# Patient Record
Sex: Male | Born: 1990 | Race: White | Hispanic: Yes | Marital: Single | Smoking: Never smoker
Health system: Southern US, Community
[De-identification: ages and names within clinical notes are randomized; demographics above are authoritative.]

---

## 2015-01-18 ENCOUNTER — Ambulatory Visit
Admission: RE | Admit: 2015-01-18 | Discharge: 2015-01-18 | Disposition: A | Discharge status: Home / Self Care | Payer: Self-pay | Source: Ambulatory Visit | Attending: *Deleted | Admitting: *Deleted

## 2015-01-18 ENCOUNTER — Other Ambulatory Visit: Payer: Self-pay | Admitting: *Deleted

## 2015-01-18 DIAGNOSIS — R52 Pain, unspecified: Secondary | ICD-10-CM

## 2015-01-19 ENCOUNTER — Encounter (HOSPITAL_COMMUNITY): Payer: Self-pay | Admitting: Emergency Medicine

## 2015-01-19 ENCOUNTER — Emergency Department (HOSPITAL_COMMUNITY)
Admission: EM | Admit: 2015-01-19 | Discharge: 2015-01-19 | Disposition: A | Discharge status: Home / Self Care | Payer: 59 | Source: Home / Self Care | Attending: Emergency Medicine | Admitting: Emergency Medicine

## 2015-01-19 DIAGNOSIS — S62001A Unspecified fracture of navicular [scaphoid] bone of right wrist, initial encounter for closed fracture: Secondary | ICD-10-CM

## 2015-01-19 NOTE — ED Notes (Signed)
Pt was sent by [facility] doctor here for right scaphoid fx onset 3 days Reports he was punching a punching bag and inj his right wrist [facility]  Alert, no signs of acute distress

## 2015-01-19 NOTE — ED Provider Notes (Signed)
CSN: [id number]     Arrival date & time 01/19/15  1846 History   First MD Initiated Contact with Patient 01/19/15 1932     Chief Complaint  Patient presents with  . Hand Injury   (Consider location/radiation/quality/duration/timing/severity/associated sxs/prior Treatment) HPI      24 year old male presents on referral from his primary care doctor. He was punching a punching bag 3 days ago when he injured his wrist. He had an x-ray and was found to have a nondisplaced scaphoid fracture. His doctor told him to hold his hand like he is holding a cup and to not move it until he gets a splint or a cast.  He has pain in his wrist around the base of his thumb as well as some swelling. No numbness, no other injury  History reviewed. No pertinent past medical history. History reviewed. No pertinent past surgical history. No family history on file. History  Substance Use Topics  . Smoking status: Never Smoker   . Smokeless tobacco: Not on file  . Alcohol Use: No    Review of Systems  Musculoskeletal: Positive for arthralgias.  All other systems reviewed and are negative.   Allergies  Review of patient's allergies indicates no known allergies.  Home Medications   Prior to Admission medications   Not on File   BP 117/73 mmHg  Pulse 72  Temp(Src) 98.7 F (37.1 C) (Oral)  Resp 16  SpO2 100% Physical Exam  Constitutional: He is oriented to person, place, and time. He appears well-developed and well-nourished. No distress.  HENT:  Head: Normocephalic.  Pulmonary/Chest: Effort normal. No respiratory distress.  Musculoskeletal:       Right hand: He exhibits tenderness (anatomic snuffbox tenderness) and swelling.  Neurological: He is alert and oriented to person, place, and time. Coordination normal.  Skin: Skin is warm and dry. No rash noted. He is not diaphoretic.  Psychiatric: He has a normal mood and affect. Judgment normal.  Nursing note and vitals reviewed.   ED Course   Procedures (including critical care time) Labs Review Labs Reviewed - No data to display  Imaging Review Dg Shoulder Right  01/18/2015   CLINICAL DATA:  Right shoulder injury 01/16/2015 when he hitting a punching bag. Initial encounter.  EXAM: RIGHT SHOULDER - 2+ VIEW  COMPARISON:  None.  FINDINGS: Imaged bones, joints and soft tissues appear normal.  IMPRESSION: Normal exam.   Electronically Signed   By: Drusilla Kannerhomas  Dalessio M.D.   On: 01/18/2015 21:18   Dg Wrist Complete Right  01/18/2015   CLINICAL DATA:  Right wrist injury 01/16/2015 when hitting a punching bag. Initial encounter.  EXAM: RIGHT WRIST - COMPLETE 3+ VIEW  COMPARISON:  None.  FINDINGS: The patient has a nondisplaced fracture through the waist of the scaphoid. No other acute bony or joint abnormality is identified. Soft tissue swelling is present about the wrist.  IMPRESSION: Nondisplaced fracture waist of the scaphoid.   Electronically Signed   By: Drusilla Kannerhomas  Dalessio M.D.   On: 01/18/2015 21:17     MDM   1. Scaphoid fracture, wrist, closed, right, initial encounter    He was put in a thumb spica splint and referred to hand surgery for follow-up.    Graylon GoodZachary H Dominic Mahaney, PA-C 01/19/15 [phone]

## 2015-01-19 NOTE — Progress Notes (Signed)
Orthopedic Tech Progress Note Patient Details:  Beatris SiRobert Garris 1991-09-11 [id number] Applied fiberglass thumb spica splint to RUE.  Pulses, motion, sensation intact before and after splinting.  Capillary refill less than 2 seconds before and after splinting. Ortho Devices Type of Ortho Device: Thumb spica splint Splint Material: Fiberglass Ortho Device/Splint Location: RUE Ortho Device/Splint Interventions: Application   Lesle ChrisGilliland, Vaniyah Lansky L 01/19/2015, 8:20 PM

## 2015-01-19 NOTE — ED Notes (Signed)
ortho tech paged. 

## 2015-01-19 NOTE — Discharge Instructions (Signed)
Scaphoid Fracture, Wrist A fracture is a break in the bone. The bone you have broken often does not show up as a fracture on x-ray until later on in the healing phase. This bone is called the scaphoid bone. With this bone, your caregiver will often cast or splint your wrist as though it is fractured, even if a fracture is not seen on the x-ray. This is often done with wrist injuries in which there is tenderness at the base of the thumb. An x-ray at 1-3 weeks after your injury may confirm this fracture. A cast or splint is used to protect and keep your injured bone in good position for healing. The cast or splint will be on generally for about 6 to 16 weeks, depending on your health, age, the fracture location and how quickly you heal. Another name for the scaphoid bone is the navicular bone. HOME CARE INSTRUCTIONS   To lessen the swelling and pain, keep the injured part elevated above your heart while sitting or lying down.  Apply ice to the injury for 15-20 minutes, 03-04 times per day while awake, for 2 days. Put the ice in a plastic bag and place a thin towel between the bag of ice and your cast.  If you have a plaster or fiberglass cast or splint:  Do not try to scratch the skin under the cast using sharp or pointed objects.  Check the skin around the cast every day. You may put lotion on any red or sore areas.  Keep your cast or splint dry and clean.  If you have a plaster splint:  Wear the splint as directed.  You may loosen the elastic bandage around the splint if your fingers become numb, tingle, or turn cold or blue.  If you have been put in a removable splint, wear and use as directed.  Do not put pressure on any part of your cast or splint; it may deform or break. Rest your cast or splint only on a pillow the first 24 hours until it is fully hardened.  Your cast or splint can be protected during bathing with a plastic bag. Do not lower the cast or splint into water.  Only take  over-the-counter or prescription medicines for pain, discomfort, or fever as directed by your caregiver.  If your caregiver has given you a follow up appointment, it is very important to keep that appointment. Not keeping the appointment could result in chronic pain and decreased function. If there is any problem keeping the appointment, you must call back to this facility for assistance. SEEK IMMEDIATE MEDICAL CARE IF:   Your cast gets damaged, wet or breaks.  You have continued severe pain or more swelling than you did before the cast or splint was put on.  Your skin or nails below the injury turn blue or gray, or feel cold or numb.  You have tingling or burning pain in your fingers or increasing pain with movement of your fingers Document Released: 11/21/2002 Document Revised: 02/23/2012 Document Reviewed: 07/20/2009 [employer] Patient Information 2015 [employer]. This information is not intended to replace advice given to you by your health care provider. Make sure you discuss any questions you have with your health care provider.  

## 2015-08-13 IMAGING — CR DG WRIST COMPLETE 3+V*R*
4 series · 4 of 4 positions shown · non-contrast
Comparison: None.

CLINICAL DATA: Right wrist injury 01/16/2015 when hitting a
punching bag. Initial encounter.

EXAM:
RIGHT WRIST - COMPLETE 3+ VIEW

[w wrist pa right]
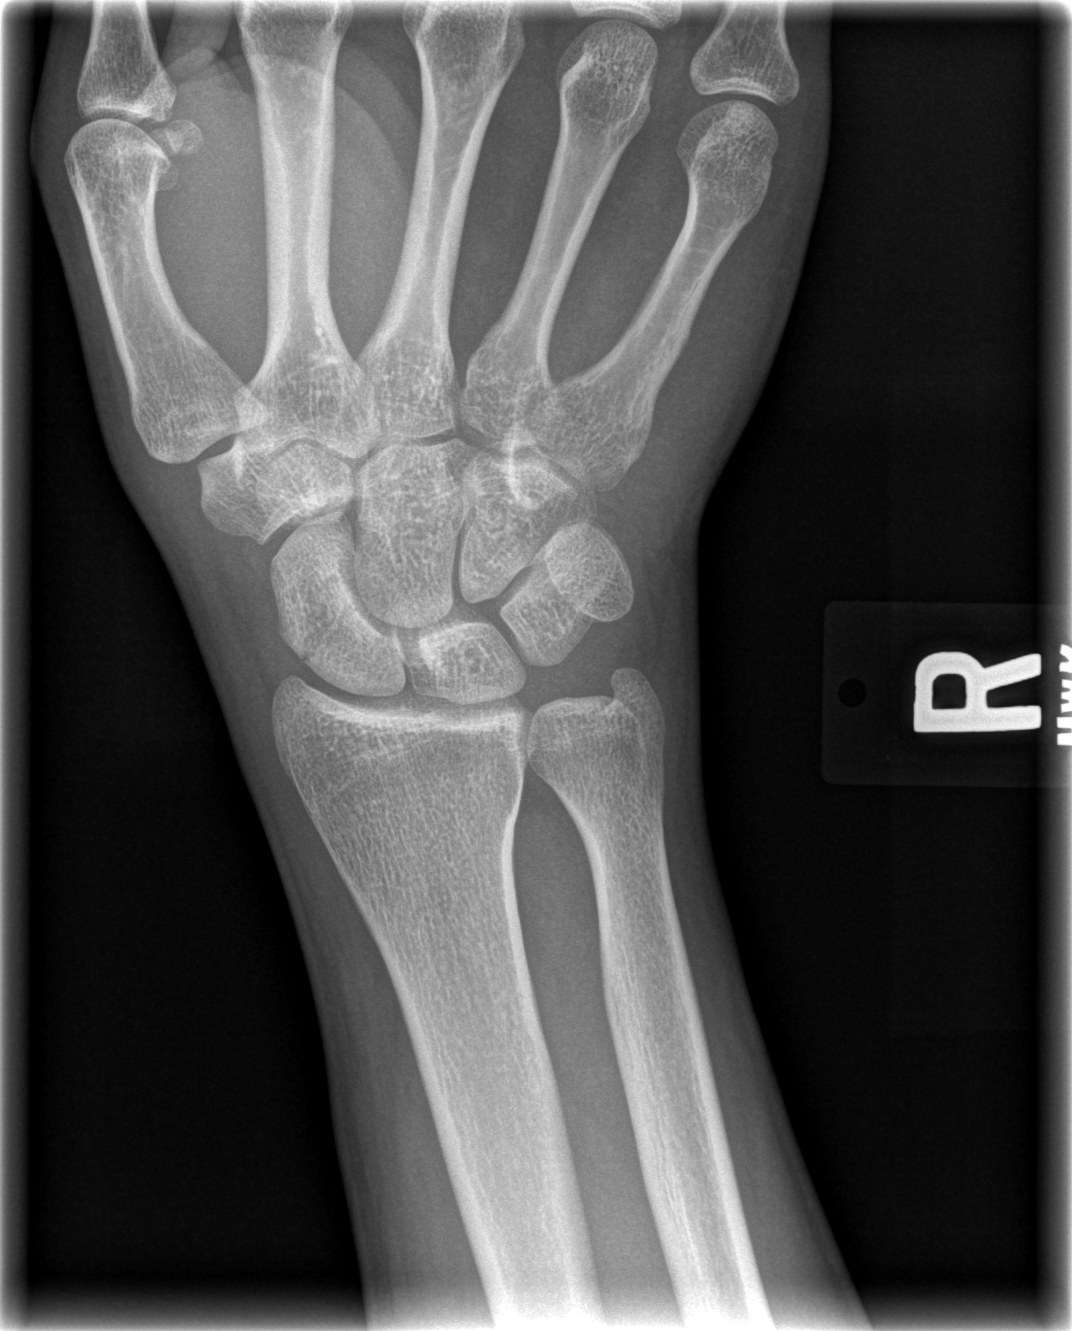

[w wrist obl. right]
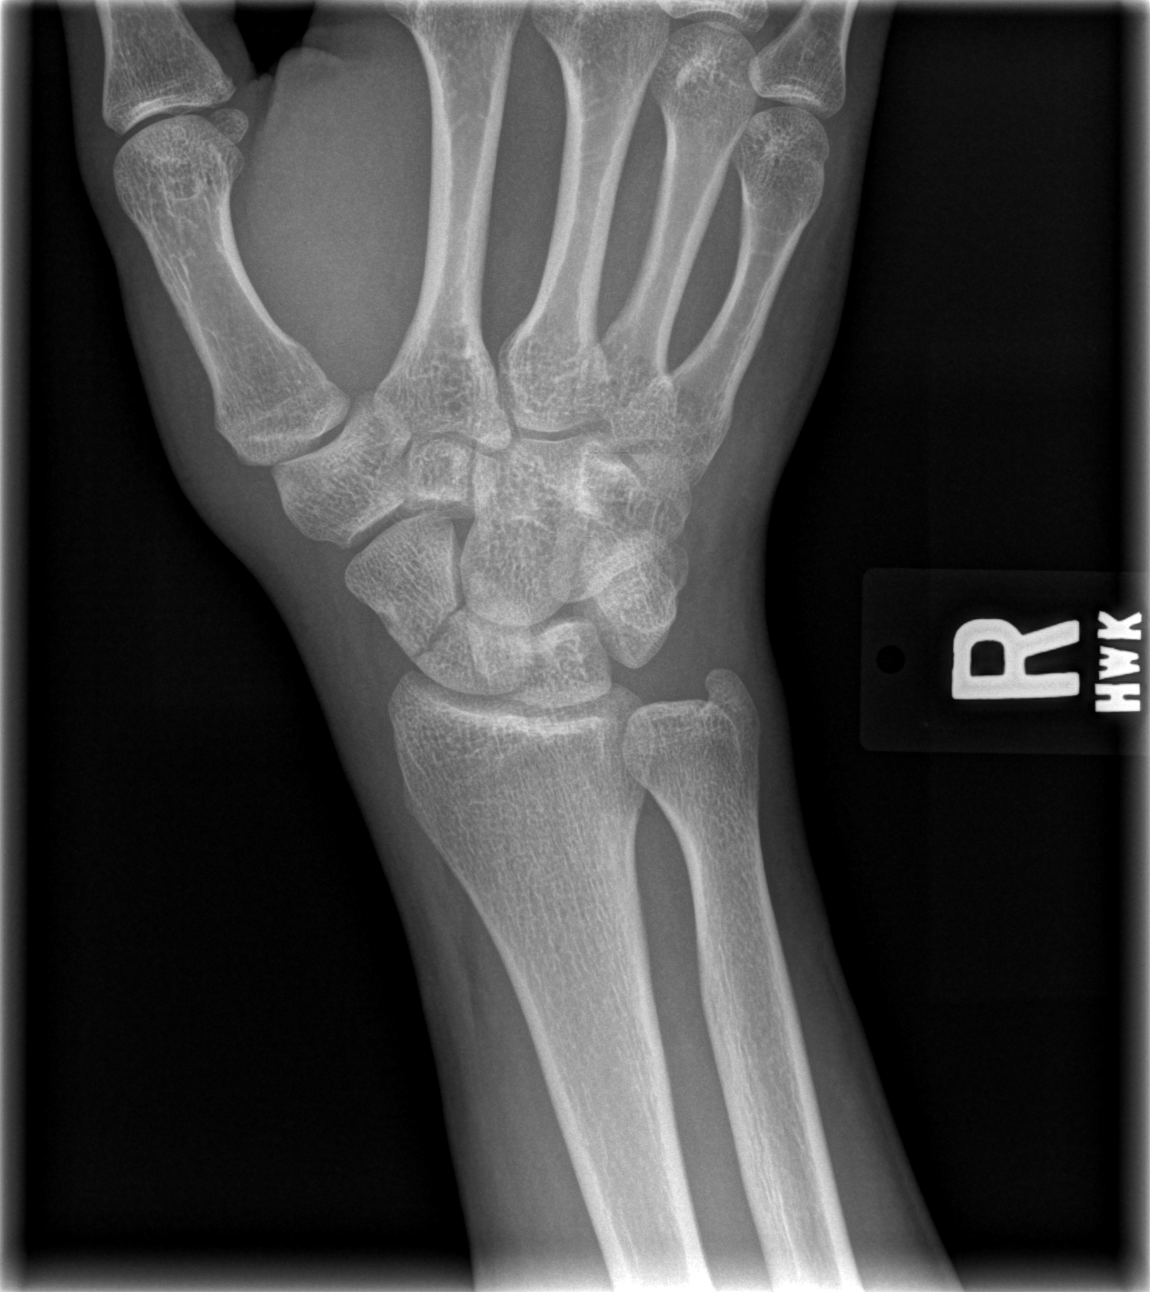

[w wrist lat right]
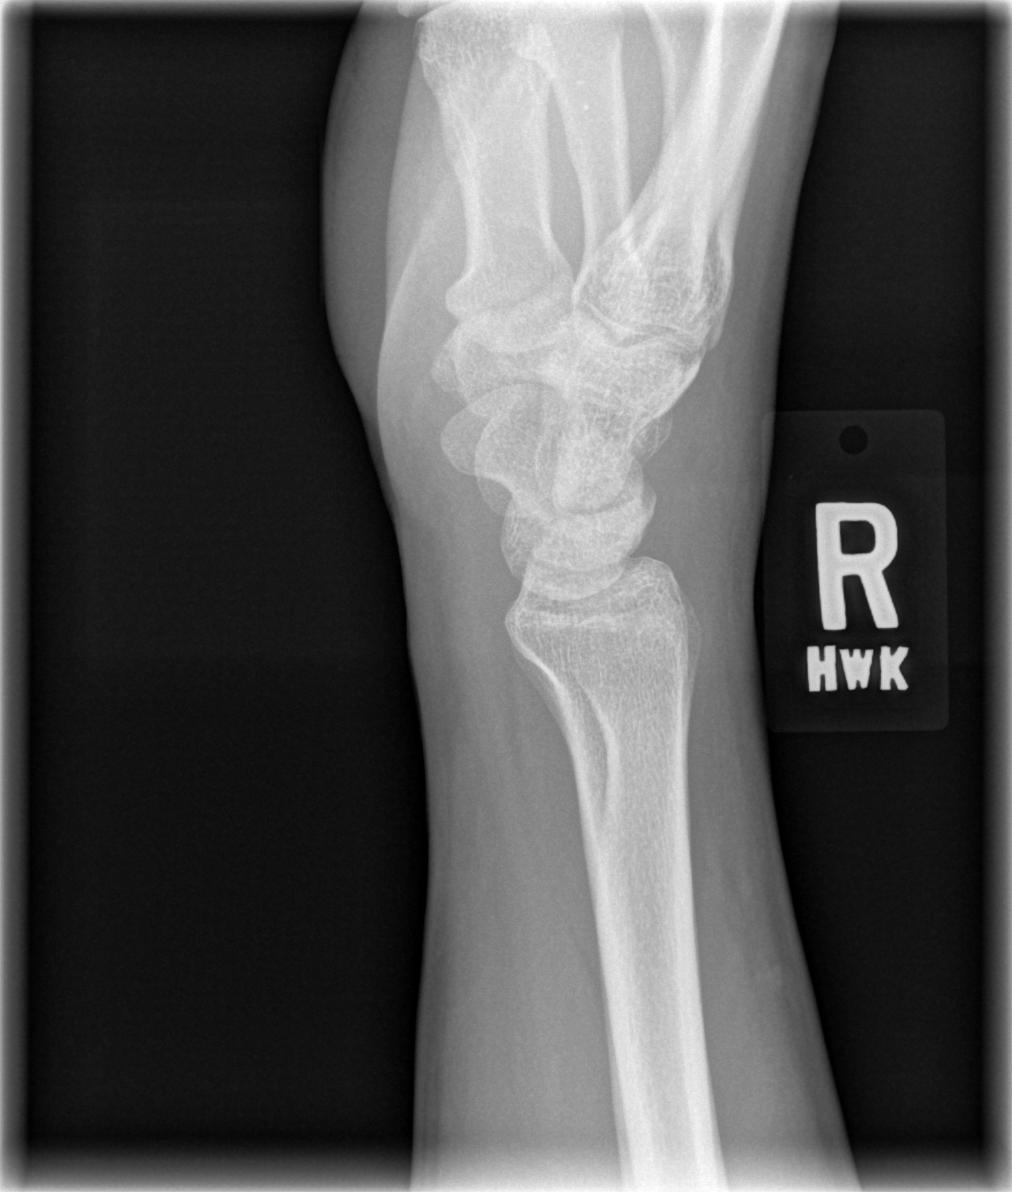

[w navicular]
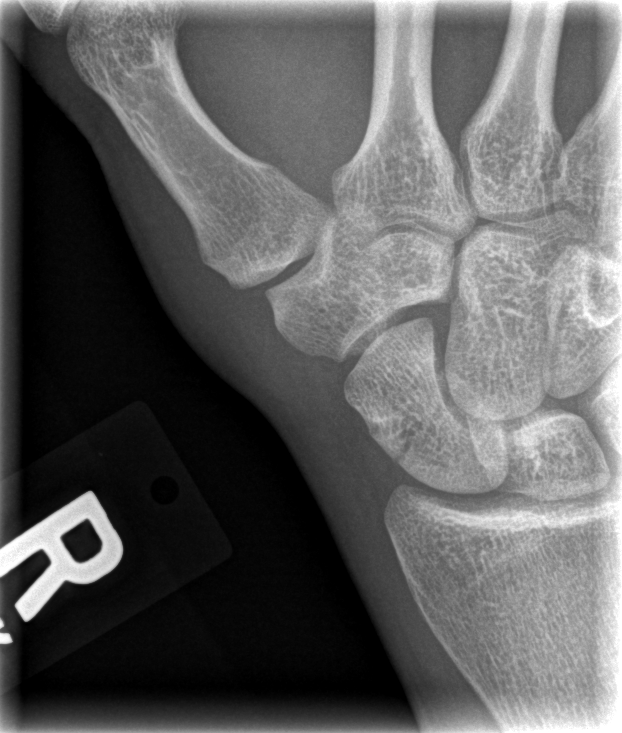

[4 of 4 positions shown; findings below may reference images not displayed]

FINDINGS: The patient has a nondisplaced fracture through the waist of the
scaphoid. No other acute bony or joint abnormality is identified.
Soft tissue swelling is present about the wrist.
IMPRESSION: Nondisplaced fracture waist of the scaphoid.
